# Patient Record
Sex: Male | Born: 1995 | Race: Black or African American | Hispanic: No | Marital: Single | Smoking: Former smoker
Health system: Southern US, Community
[De-identification: ages and names within clinical notes are randomized; demographics above are authoritative.]

---

## 2017-03-17 ENCOUNTER — Encounter (HOSPITAL_COMMUNITY): Payer: Self-pay | Admitting: Emergency Medicine

## 2017-03-17 ENCOUNTER — Observation Stay (HOSPITAL_COMMUNITY)
Admission: EM | Admit: 2017-03-17 | Discharge: 2017-03-18 | Disposition: A | Discharge status: Home / Self Care | Payer: PRIVATE HEALTH INSURANCE | Attending: Cardiovascular Disease | Admitting: Cardiovascular Disease

## 2017-03-17 ENCOUNTER — Emergency Department (HOSPITAL_COMMUNITY): Payer: PRIVATE HEALTH INSURANCE

## 2017-03-17 ENCOUNTER — Ambulatory Visit (INDEPENDENT_AMBULATORY_CARE_PROVIDER_SITE_OTHER)
Admission: EM | Admit: 2017-03-17 | Discharge: 2017-03-17 | Disposition: A | Discharge status: Home / Self Care | Payer: PRIVATE HEALTH INSURANCE | Source: Home / Self Care | Attending: Emergency Medicine | Admitting: Emergency Medicine

## 2017-03-17 ENCOUNTER — Other Ambulatory Visit: Payer: Self-pay

## 2017-03-17 DIAGNOSIS — I514 Myocarditis, unspecified: Secondary | ICD-10-CM | POA: Diagnosis present

## 2017-03-17 DIAGNOSIS — I401 Isolated myocarditis: Secondary | ICD-10-CM

## 2017-03-17 DIAGNOSIS — Z87891 Personal history of nicotine dependence: Secondary | ICD-10-CM | POA: Insufficient documentation

## 2017-03-17 DIAGNOSIS — R778 Other specified abnormalities of plasma proteins: Secondary | ICD-10-CM

## 2017-03-17 DIAGNOSIS — Z8249 Family history of ischemic heart disease and other diseases of the circulatory system: Secondary | ICD-10-CM | POA: Insufficient documentation

## 2017-03-17 DIAGNOSIS — R0789 Other chest pain: Secondary | ICD-10-CM | POA: Diagnosis present

## 2017-03-17 DIAGNOSIS — I400 Infective myocarditis: Secondary | ICD-10-CM

## 2017-03-17 DIAGNOSIS — R7989 Other specified abnormal findings of blood chemistry: Secondary | ICD-10-CM | POA: Insufficient documentation

## 2017-03-17 DIAGNOSIS — K219 Gastro-esophageal reflux disease without esophagitis: Secondary | ICD-10-CM | POA: Insufficient documentation

## 2017-03-17 DIAGNOSIS — R079 Chest pain, unspecified: Secondary | ICD-10-CM | POA: Diagnosis not present

## 2017-03-17 LAB — BASIC METABOLIC PANEL
ANION GAP: 10 (ref 5–15)
BUN: 5 mg/dL — ABNORMAL LOW (ref 6–20)
CALCIUM: 9.6 mg/dL (ref 8.9–10.3)
CO2: 26 mmol/L (ref 22–32)
CREATININE: 1.13 mg/dL (ref 0.61–1.24)
Chloride: 103 mmol/L (ref 101–111)
GLUCOSE: 109 mg/dL — AB (ref 65–99)
Potassium: 4.3 mmol/L (ref 3.5–5.1)
Sodium: 139 mmol/L (ref 135–145)

## 2017-03-17 LAB — CBC
HCT: 48.7 % (ref 39.0–52.0)
HEMOGLOBIN: 16.2 g/dL (ref 13.0–17.0)
MCH: 28.3 pg (ref 26.0–34.0)
MCHC: 33.3 g/dL (ref 30.0–36.0)
MCV: 85 fL (ref 78.0–100.0)
PLATELETS: 213 10*3/uL (ref 150–400)
RBC: 5.73 MIL/uL (ref 4.22–5.81)
RDW: 12.6 % (ref 11.5–15.5)
WBC: 7 10*3/uL (ref 4.0–10.5)

## 2017-03-17 LAB — RAPID URINE DRUG SCREEN, HOSP PERFORMED
AMPHETAMINES: NOT DETECTED
BENZODIAZEPINES: NOT DETECTED
Barbiturates: NOT DETECTED
Cocaine: NOT DETECTED
OPIATES: NOT DETECTED
Tetrahydrocannabinol: NOT DETECTED

## 2017-03-17 LAB — TROPONIN I
TROPONIN I: 10.67 ng/mL — AB (ref <0.03)
TROPONIN I: 9.72 ng/mL — AB (ref <0.03)
Troponin I: 6.83 ng/mL (ref <0.03)

## 2017-03-17 LAB — PROTIME-INR
INR: 1
Prothrombin Time: 13.1 seconds (ref 11.4–15.2)

## 2017-03-17 LAB — I-STAT TROPONIN, ED: TROPONIN I, POC: 8.94 ng/mL — AB (ref 0.00–0.08)

## 2017-03-17 LAB — TSH: TSH: 2.459 u[IU]/mL (ref 0.350–4.500)

## 2017-03-17 LAB — APTT: aPTT: 32 seconds (ref 24–36)

## 2017-03-17 MED ORDER — METOPROLOL TARTRATE 25 MG PO TABS
25.0000 mg | ORAL_TABLET | Freq: Two times a day (BID) | ORAL | Status: DC
  Administered 2017-03-17 – 2017-03-18 (×2): 25 mg via ORAL
  Filled 2017-03-17 (×2): qty 1

## 2017-03-17 MED ORDER — ACETAMINOPHEN 325 MG PO TABS: 650.0000 mg | ORAL_TABLET | ORAL | Status: DC | PRN

## 2017-03-17 MED ORDER — IBUPROFEN 800 MG PO TABS: 800.0000 mg | ORAL_TABLET | Freq: Four times a day (QID) | ORAL | Status: DC

## 2017-03-17 MED ORDER — ONDANSETRON HCL 4 MG/2ML IJ SOLN: 4.0000 mg | Freq: Four times a day (QID) | INTRAMUSCULAR | Status: DC | PRN

## 2017-03-17 MED ORDER — NITROGLYCERIN 0.4 MG SL SUBL: 0.4000 mg | SUBLINGUAL_TABLET | SUBLINGUAL | Status: DC | PRN

## 2017-03-17 MED ORDER — ASPIRIN 81 MG PO CHEW
CHEWABLE_TABLET | ORAL | Status: AC
  Administered 2017-03-17: 81 mg
  Filled 2017-03-17: qty 1

## 2017-03-17 MED ORDER — PANTOPRAZOLE SODIUM 40 MG PO TBEC: 40.0000 mg | DELAYED_RELEASE_TABLET | Freq: Every day | ORAL | Status: DC

## 2017-03-17 MED ORDER — COLCHICINE 0.6 MG PO TABS: 0.6000 mg | ORAL_TABLET | Freq: Two times a day (BID) | ORAL | Status: DC

## 2017-03-17 MED ORDER — HEPARIN BOLUS VIA INFUSION
4000.0000 [IU] | Freq: Once | INTRAVENOUS | Status: AC
  Administered 2017-03-17: 4000 [IU] via INTRAVENOUS
  Filled 2017-03-17: qty 4000

## 2017-03-17 MED ORDER — COLCHICINE 0.6 MG PO TABS
0.6000 mg | ORAL_TABLET | Freq: Once | ORAL | Status: AC
  Administered 2017-03-17: 0.6 mg via ORAL
  Filled 2017-03-17: qty 1

## 2017-03-17 MED ORDER — HEPARIN (PORCINE) IN NACL 100-0.45 UNIT/ML-% IJ SOLN
900.0000 [IU]/h | INTRAMUSCULAR | Status: DC
  Administered 2017-03-17: 900 [IU]/h via INTRAVENOUS
  Filled 2017-03-17: qty 250

## 2017-03-17 MED ORDER — ASPIRIN 81 MG PO CHEW
324.0000 mg | CHEWABLE_TABLET | Freq: Once | ORAL | Status: AC
  Administered 2017-03-17: 243 mg via ORAL
  Filled 2017-03-17: qty 4

## 2017-03-17 MED ORDER — IBUPROFEN 400 MG PO TABS
600.0000 mg | ORAL_TABLET | Freq: Three times a day (TID) | ORAL | Status: DC
  Administered 2017-03-18: 12:00:00 600 mg via ORAL
  Filled 2017-03-17 (×2): qty 1

## 2017-03-17 MED ORDER — ASPIRIN 325 MG PO TABS
325.0000 mg | ORAL_TABLET | Freq: Every day | ORAL | Status: DC
  Administered 2017-03-18: 325 mg via ORAL
  Filled 2017-03-17: qty 1

## 2017-03-17 MED ORDER — COLCHICINE 0.6 MG PO TABS
0.6000 mg | ORAL_TABLET | Freq: Two times a day (BID) | ORAL | Status: DC
  Administered 2017-03-18: 0.6 mg via ORAL
  Filled 2017-03-17: qty 1

## 2017-03-17 MED ORDER — SODIUM CHLORIDE 0.9 % IV SOLN
INTRAVENOUS | Status: DC
  Administered 2017-03-17: 50 mL/h via INTRAVENOUS

## 2017-03-17 MED ORDER — IBUPROFEN 400 MG PO TABS
600.0000 mg | ORAL_TABLET | Freq: Once | ORAL | Status: AC
  Administered 2017-03-17: 19:00:00 600 mg via ORAL
  Filled 2017-03-17: qty 1

## 2017-03-17 NOTE — ED Notes (Signed)
EKG captured and given to Dr Rhunette CroftNanavati for assessment. Monitor read "ST high" Dr Rhunette CroftNanavati just ordered to continue to monitor, no new orders.

## 2017-03-17 NOTE — ED Notes (Signed)
Nurse First and MD Rhunette CroftNanavati informed of pt's I-stat Troponin resulting to be 8.93.

## 2017-03-17 NOTE — ED Notes (Signed)
Report and care gotten from [place] at 2305.

## 2017-03-17 NOTE — Discharge Instructions (Signed)
Let them know immediately if your chest pain changes or gets worse.

## 2017-03-17 NOTE — ED Notes (Addendum)
Notified Dr. Juleen ChinaKohut about pts EKG done at [facility]gent [facility] less than an hour ago, wanted to see if we should get another one for his visit in the ED. Dr. Juleen ChinaKohut acknowledge and said to wait on the EKG until a ED doctor sees him. Notified Holley(RN)

## 2017-03-17 NOTE — ED Provider Notes (Signed)
HPI  SUBJECTIVE:  Omar Blanchard is a 21 y.o. male who presents with substernal constant chest pain described as pressure, throbbing with radiation to his left arm for the past several days.  There are no aggravating or alleviating factors.  He has not tried anything for this.  He denies radiation of this pain up his neck or through to his back.  No nausea, diaphoresis.  No belching, water brash.  It is not associated with exertion, lying down, eating, torso rotation or movement.  No fevers, coughing, wheezing, shortness of breath.  No abdominal pain.  No calf pain, lower extremity edema, dyspnea on exertion.  No hemoptysis, surgery in the past 4 weeks, immobilization, exogenous estrogen.  Past medical history of GERD.  No history of hypertension, diabetes, coronary disease, DVT, PE, cancer, hypercholesterolemia, MI, HIV.  He is not a smoker.  He had asthma as a child.  Family history significant for MI father at age 21.  PMD: None.  History reviewed. No pertinent past medical history.  History reviewed. No pertinent surgical history.  No family history on file.  Social History   Tobacco Use  . Smoking status: Not on file  Substance Use Topics  . Alcohol use: Not on file  . Drug use: Not on file    No current facility-administered medications for this encounter.  No current outpatient medications on file.  No Known Allergies   ROS  As noted in HPI.   Physical Exam  BP 139/82   Pulse 60   Temp 98.4 F (36.9 C)   Resp 16   SpO2 100%   Constitutional: Well developed, well nourished, no acute distress Eyes: PERRL, EOMI, conjunctiva normal bilaterally HENT: Normocephalic, atraumatic,mucus membranes moist Respiratory: Clear to auscultation bilaterally, no rales, no wheezing, no rhonchi.  No chest wall tenderness Cardiovascular: Normal rate and rhythm, no murmurs, no gallops, no rubs GI: Soft, nondistended, no pulsatile palpable masses nontender, no rebound, no  guarding skin: No rash, skin intact Musculoskeletal: Calves symmetric, nontender no edema,  no deformities Neurologic: Alert & oriented x 3, CN II-XII grossly intact, no motor deficits, sensation grossly intact Psychiatric: Speech and behavior appropriate   ED Course   Medications - No data to display  Orders Placed This Encounter  Procedures  . ED EKG    Standing Status:   Standing    Number of Occurrences:   1    Order Specific Question:   Reason for Exam    Answer:   Chest Pain  . EKG 12-Lead    Standing Status:   Standing    Number of Occurrences:   1  . EKG 12-Lead    Standing Status:   Standing    Number of Occurrences:   1   No results found for this or any previous visit (from the past 24 hour(s)). No results found.  ED Clinical Impression  Chest pain, unspecified type   ED Assessment/Plan  EKG: Normal sinus rhythm, rate 65.  Normal axis, normal intervals.  LVH.  No ST-T wave changes.  No previous EKG for comparison  Pt PERC negative.  Doubt PE.  EKG is reassuring.  However, given the nature of his pain, with radiation down his left arm and the family history, concern for cardiac cause of his pain.  Sending to the ED for comprehensive workup.  Feel that the patient is stable to go by private vehicle.  Discussed rationale for transfer with the patient, who agrees with plan.   No  orders of the defined types were placed in this encounter.   *This clinic note was created using Dragon dictation software. Therefore, there may be occasional mistakes despite careful proofreading.  ?    Domenick GongMortenson, Aurthur Wingerter, MD 03/17/17 [phone]

## 2017-03-17 NOTE — Progress Notes (Signed)
ANTICOAGULATION CONSULT NOTE - Initial Consult  Pharmacy Consult for Heparin  Indication: chest pain/ACS  No Known Allergies  Patient Measurements: Height: 5\' 6"  (167.6 cm) Weight: 150 lb (68 kg) IBW/kg (Calculated) : 63.8 Heparin Dosing Weight: 68kg   Vital Signs: Temp: 98.7 F (37.1 C) (11/12 1628) Temp Source: Oral (11/12 1628) BP: 124/88 (11/12 1715) Pulse Rate: 64 (11/12 1715)  Labs: Recent Labs    03/17/17 1628  HGB 16.2  HCT 48.7  PLT 213  CREATININE 1.13    Estimated Creatinine Clearance: 93.3 mL/min (by C-G formula based on SCr of 1.13 mg/dL).   Medical History: History reviewed. No pertinent past medical history.   Assessment: Pt 21YO male presenting with left sided chest pain. FMH significant for father MI at 520.  Pharmacy consulted to dose Heparin.   Troponin 8.94, Hgb 16.2, platelets 213, No s/sx active bleeding, No anticoagulation prior to admission   Goal of Therapy:  Heparin level 0.3-0.7 units/ml Monitor platelets by anticoagulation protocol: Yes   Plan:  Give Heparin 4000 units bolus x 1 Start heparin infusion at 900 units/hr Continue to monitor H&H and platelets   F/U 6 hr heparin level and daily CBC and heparin level, any s/sx of bleeding   Karna DupesMedina Nadiah Corbit  PharmD Candidate '19 03/10/2017 3:30 PM

## 2017-03-17 NOTE — ED Provider Notes (Signed)
[facility] Provider Note   CSN: [id number] Arrival date & time: 03/17/17  1619 History   Chief Complaint Chief Complaint  Patient presents with  . Chest Pain    HPI Omar Blanchard is a 21 y.o. male with no significant past medical history who presents with throbbing chest pressure for the past 2-3 days.  Patient states the pain radiates down his left arm.  He denies that it is worse with exertion and states that has not noted anything that really makes it worse.  Denies anything makes it better except trying to take his mind off of it.  He has not had any associated shortness of breath.  Denies any history or recent use of cocaine or any other drugs.  Patient states that his father had a heart attack in his 7720s but does not know why he had it once or early.  He denies any personal or family history of blood clots or clotting disorders.  He has not had any recent fevers or chills.  HPI  History reviewed. No pertinent past medical history.  There are no active problems to display for this patient.  History reviewed. No pertinent surgical history.  Home Medications    Prior to Admission medications   Not on File   Family History History reviewed. No pertinent family history.  Social History Social History   Tobacco Use  . Smoking status: Former Games developermoker  . Smokeless tobacco: Never Used  Substance Use Topics  . Alcohol use: Yes    Comment: occasional   . Drug use: No   Allergies   Patient has no known allergies.  Review of Systems Review of Systems  Constitutional: Negative for chills and fever.  HENT: Negative for ear pain and sore throat.   Eyes: Negative for pain and visual disturbance.  Respiratory: Negative for cough and shortness of breath.   Cardiovascular: Positive for chest pain. Negative for palpitations.  Gastrointestinal: Negative for abdominal pain and vomiting.  Genitourinary: Negative for dysuria and hematuria.    Musculoskeletal: Negative for arthralgias and back pain.  Skin: Negative for color change and rash.  Neurological: Negative for seizures and syncope.  All other systems reviewed and are negative.  Physical Exam Updated Vital Signs BP (!) 158/85 (BP Location: Right Arm)   Pulse (!) 56   Temp 98.7 F (37.1 C) (Oral)   Resp 16   Ht 5\' 6"  (1.676 m)   Wt 68 kg (150 lb)   SpO2 100%   BMI 24.21 kg/m   Physical Exam  Constitutional: He appears well-developed and well-nourished.  HENT:  Head: Normocephalic and atraumatic.  Eyes: Conjunctivae are normal.  Neck: Neck supple.  Cardiovascular: Normal rate and regular rhythm.  No murmur heard. Pulmonary/Chest: Effort normal and breath sounds normal. No respiratory distress.  Abdominal: Soft. There is no tenderness.  Musculoskeletal: He exhibits no edema.  Neurological: He is alert.  Skin: Skin is warm and dry.  Psychiatric: He has a normal mood and affect.  Nursing note and vitals reviewed.  ED Treatments / Results  Labs (all labs ordered are listed, but only abnormal results are displayed) Labs Reviewed  BASIC METABOLIC PANEL - Abnormal; Notable for the following components:      Result Value   Glucose, Bld 109 (*)    BUN 5 (*)    All other components within normal limits  I-STAT TROPONIN, ED - Abnormal; Notable for the following components:   Troponin i, poc 8.94 (*)  All other components within normal limits  CBC  TROPONIN I  TROPONIN I  APTT  PROTIME-INR  RAPID URINE DRUG SCREEN, HOSP PERFORMED    EKG  EKG Interpretation None       Radiology Dg Chest 2 View  Result Date: 03/17/2017 CLINICAL DATA:  CP. Pt has been having cp today in his middle chest, and slightly to the left of his sternum. No sob, no dizziness or nausea. No other pains or complaints. EXAM: CHEST  2 VIEW COMPARISON:  None. FINDINGS: The heart size and mediastinal contours are within normal limits. Both lungs are clear. No pleural effusion or  pneumothorax. The visualized skeletal structures are unremarkable. IMPRESSION: No active cardiopulmonary disease. Electronically Signed   By: Amie Portlandavid  Ormond M.D.   On: 03/17/2017 17:40    Procedures Procedures (including critical care time)  Medications Ordered in ED Medications  colchicine tablet 0.6 mg (0.6 mg Oral Not Given 03/17/17 2300)  aspirin tablet 325 mg (not administered)  metoprolol tartrate (LOPRESSOR) tablet 25 mg (25 mg Oral Given 03/17/17 2323)  colchicine tablet 0.6 mg (not administered)  ibuprofen (ADVIL,MOTRIN) tablet 600 mg (0 mg Oral Hold 03/17/17 2316)  nitroGLYCERIN (NITROSTAT) SL tablet 0.4 mg (not administered)  acetaminophen (TYLENOL) tablet 650 mg (not administered)  ondansetron (ZOFRAN) injection 4 mg (not administered)  0.9 %  sodium chloride infusion (50 mL/hr Intravenous New Bag/Given 03/17/17 2322)  pantoprazole (PROTONIX) EC tablet 40 mg (0 mg Oral Hold 03/17/17 2319)  aspirin chewable tablet 324 mg (243 mg Oral Given 03/17/17 1716)  aspirin 81 MG chewable tablet (81 mg  Given 03/17/17 1742)  heparin bolus via infusion 4,000 Units (4,000 Units Intravenous Bolus from Bag 03/17/17 1758)  colchicine tablet 0.6 mg (0.6 mg Oral Given 03/17/17 1900)  ibuprofen (ADVIL,MOTRIN) tablet 600 mg (600 mg Oral Given 03/17/17 1900)   Initial Impression / Assessment and Plan / ED Course  I have reviewed the triage vital signs and the nursing notes.  Pertinent labs & imaging results that were available during my care of the patient were reviewed by me and considered in my medical decision making (see chart for details).  Clinical Course as of Mar 18 228  Mon Mar 17, 2017  1715 MCV: 85.0 [AM]    Clinical Course User Index [AM] Domenick GongMortenson, Ashley, MD    Omar Blanchard is a 21 y.o. male with no sitting past medical history who presents with chest pain found to have an elevated troponin.  Studies and imaging  Labs remarkable for: trop 8.94, CBC wnl, BMP wnl CXR:  NAD  EKG: no evidence of acute ST elevated MI, arrhthymias or   Discussed this patient's case with the cardiologist who at this time after reviewing the EKG and lab believes that the patient symptoms are most likely secondary to myocarditis.    Heparin discontinued at cardiology's recommendation, colchicine and motrin started in the ED.  Troponin trending down.   Discussed patient's case with cardiology who will admit the patient.  Final Clinical Impressions(s) / ED Diagnoses   Final diagnoses:  Chest pain  Elevated troponin  Acute idiopathic myocarditis    ED Discharge Orders    None       Lamont SnowballGarza, Johngabriel Verde, MD 03/18/17 [id number]    Derwood KaplanNanavati, Ankit, MD 03/18/17 1453

## 2017-03-17 NOTE — ED Triage Notes (Signed)
Pt c/o pressure in chest.

## 2017-03-17 NOTE — Consult Note (Signed)
CARDIOLOGY CONSULT NOTE       Patient ID: Omar Blanchard MRN: [id number] DOB/AGE: 21/07/1995 21 y.o.  Admit date: 03/17/2017 Referring Physician: Rhunette CroftNanavati Primary Physician: Patient, No Pcp Per Primary Cardiologist: New/Keron Neenan Reason for Consultation: Chest Pain Elevated troponin  Active Problems:   * No active hospital problems. *   HPI:  Omar Blanchard is a 21 y.o. male who is being seen today for the evaluation of chest pain at the request of Dr Rhunette CroftNanavati. He has no previous significant medical history Denies drugs, ETOH excess , smoking. SSCP / pressure for 3 days. Initially noted when he awoke from sleep. Pain fairly constant for 3 days. ASA helped and pain free in ER now. Did not try any ASA or NSAI's at home. Has had a "cold" with sniffles but no fever No sick contacts. 306 yo brother healthy and father had MI at young age. ECG is non acute with early repolarization. Troponin I elevated 8.94. Denies dyspnea, palpitations syncope myalgias, fever cough or febrile illness. No history of connective tissue disease or lupus.   ROS All other systems reviewed and negative except as noted above  History reviewed. No pertinent past medical history.  History reviewed. No pertinent family history.  Social History   Socioeconomic History  . Marital status: Single    Spouse name: Not on file  . Number of children: Not on file  . Years of education: Not on file  . Highest education level: Not on file  Social Needs  . Financial resource strain: Not on file  . Food insecurity - worry: Not on file  . Food insecurity - inability: Not on file  . Transportation needs - medical: Not on file  . Transportation needs - non-medical: Not on file  Occupational History  . Not on file  Tobacco Use  . Smoking status: Former Games developermoker  . Smokeless tobacco: Never Used  Substance and Sexual Activity  . Alcohol use: Yes    Comment: occasional   . Drug use: No  . Sexual activity: Not on file  Other  Topics Concern  . Not on file  Social History Narrative  . Not on file    History reviewed. No pertinent surgical history.   . heparin  4,000 Units Intravenous Once   . heparin      Physical Exam: BP 124/88   Pulse 64   Temp 98.7 F (37.1 C) (Oral)   Resp 19   Ht 5\' 6"  (1.676 m)   Wt 150 lb (68 kg)   SpO2 100%   BMI 24.21 kg/m   Affect appropriate Healthy:  appears stated age HEENT: normal Neck supple with no adenopathy JVP normal no bruits no thyromegaly Lungs clear with no wheezing and good diaphragmatic motion Heart:  S1/S2 no murmur, no rub, gallop or click PMI normal Abdomen: benighn, BS positve, no tenderness, no AAA no bruit.  No HSM or HJR Distal pulses intact with no bruits No edema Neuro non-focal Skin warm and dry No muscular weakness   Labs:   Lab Results  Component Value Date   WBC 7.0 03/17/2017   HGB 16.2 03/17/2017   HCT 48.7 03/17/2017   MCV 85.0 03/17/2017   PLT 213 03/17/2017    Recent Labs  Lab 03/17/17 1628  NA 139  K 4.3  CL 103  CO2 26  BUN 5*  CREATININE 1.13  CALCIUM 9.6  GLUCOSE 109*   No results found for: CKTOTAL, CKMB, CKMBINDEX, TROPONINI No results found for: CHOL  No results found for: HDL No results found for: LDLCALC No results found for: TRIG No results found for: CHOLHDL No results found for: LDLDIRECT    Radiology: Dg Chest 2 View  Result Date: 03/17/2017 CLINICAL DATA:  CP. Pt has been having cp today in his middle chest, and slightly to the left of his sternum. No sob, no dizziness or nausea. No other pains or complaints. EXAM: CHEST  2 VIEW COMPARISON:  None. FINDINGS: The heart size and mediastinal contours are within normal limits. Both lungs are clear. No pleural effusion or pneumothorax. The visualized skeletal structures are unremarkable. IMPRESSION: No active cardiopulmonary disease. Electronically Signed   By: Amie Portlandavid  Ormond M.D.   On: 03/17/2017 17:40    EKG: SR early repolarization no acute  changes no old one to compare   ASSESSMENT AND PLAN:   1) Chest pain with elevated troponin Patient is pain free now with no ECG changes Doubt acute epicardial coronary syndrome D/C heparin More likely myocarditis in setting of viral illness. Admit Telemetry but no arrhythmias so far Rx with NSAI's and colchicine. Will order cardiac MRI in am as well as cardiac CT to r/o CAD. And assess for late gadolinium uptake in myocardium If troponin trends down and no CAD on CT and telemetry with no arrhythmia can likely be d/c late tomorrow  Signed: Charlton Hawseter Woodie Trusty 03/17/2017, 5:56 PM

## 2017-03-17 NOTE — ED Notes (Signed)
Patient transported to X-ray 

## 2017-03-17 NOTE — ED Triage Notes (Signed)
[facility] for central chest pressure/throbbing onset 3-4 days ago with radiation down left arm. Pt denies other symptoms.

## 2017-03-17 NOTE — ED Notes (Signed)
Patient requests ekg to be faxed to mother.  Dr Chaney Mallingmortenson is in agreement and provided fax number to this nurse.  Cindy/patient access faxing EKG

## 2017-03-17 NOTE — ED Notes (Signed)
Lab called with critical troponin, 6.83. MD notified.

## 2017-03-18 ENCOUNTER — Inpatient Hospital Stay (HOSPITAL_COMMUNITY): Payer: PRIVATE HEALTH INSURANCE

## 2017-03-18 ENCOUNTER — Other Ambulatory Visit: Payer: Self-pay | Admitting: Internal Medicine

## 2017-03-18 DIAGNOSIS — I409 Acute myocarditis, unspecified: Secondary | ICD-10-CM | POA: Diagnosis not present

## 2017-03-18 DIAGNOSIS — R079 Chest pain, unspecified: Secondary | ICD-10-CM | POA: Diagnosis not present

## 2017-03-18 DIAGNOSIS — I400 Infective myocarditis: Secondary | ICD-10-CM | POA: Diagnosis not present

## 2017-03-18 LAB — CBC
HEMATOCRIT: 43 % (ref 39.0–52.0)
Hemoglobin: 14.2 g/dL (ref 13.0–17.0)
MCH: 28 pg (ref 26.0–34.0)
MCHC: 33 g/dL (ref 30.0–36.0)
MCV: 84.8 fL (ref 78.0–100.0)
PLATELETS: 213 10*3/uL (ref 150–400)
RBC: 5.07 MIL/uL (ref 4.22–5.81)
RDW: 13.1 % (ref 11.5–15.5)
WBC: 5 10*3/uL (ref 4.0–10.5)

## 2017-03-18 LAB — LIPID PANEL
CHOL/HDL RATIO: 2.7 ratio
CHOLESTEROL: 123 mg/dL (ref 0–200)
HDL: 46 mg/dL (ref >40)
LDL Cholesterol: 67 mg/dL (ref 0–99)
Triglycerides: 49 mg/dL (ref <150)
VLDL: 10 mg/dL (ref 0–40)

## 2017-03-18 LAB — BASIC METABOLIC PANEL
ANION GAP: 8 (ref 5–15)
BUN: 6 mg/dL (ref 6–20)
CHLORIDE: 105 mmol/L (ref 101–111)
CO2: 23 mmol/L (ref 22–32)
Calcium: 8.8 mg/dL — ABNORMAL LOW (ref 8.9–10.3)
Creatinine, Ser: 0.98 mg/dL (ref 0.61–1.24)
GFR calc Af Amer: >60 mL/min (ref >60)
GFR calc non Af Amer: >60 mL/min (ref >60)
GLUCOSE: 80 mg/dL (ref 65–99)
POTASSIUM: 3.7 mmol/L (ref 3.5–5.1)
Sodium: 136 mmol/L (ref 135–145)

## 2017-03-18 LAB — CMV IGM: CMV IgM: <30 AU/mL (ref 0.0–29.9)

## 2017-03-18 LAB — EPSTEIN-BARR VIRUS VCA, IGG

## 2017-03-18 LAB — TROPONIN I
Troponin I: 5.68 ng/mL (ref <0.03)
Troponin I: 2.69 ng/mL (ref <0.03)

## 2017-03-18 LAB — HIV ANTIBODY (ROUTINE TESTING W REFLEX): HIV Screen 4th Generation wRfx: NONREACTIVE

## 2017-03-18 LAB — EPSTEIN-BARR VIRUS VCA, IGM

## 2017-03-18 MED ORDER — IBUPROFEN 800 MG PO TABS: 800.0000 mg | ORAL_TABLET | Freq: Three times a day (TID) | ORAL | 0 refills | Status: AC

## 2017-03-18 MED ORDER — NITROGLYCERIN 0.4 MG SL SUBL
SUBLINGUAL_TABLET | SUBLINGUAL | Status: AC
  Filled 2017-03-18: qty 1

## 2017-03-18 MED ORDER — PANTOPRAZOLE SODIUM 40 MG PO TBEC: 40.0000 mg | DELAYED_RELEASE_TABLET | Freq: Every day | ORAL | 0 refills | Status: AC

## 2017-03-18 MED ORDER — COLCHICINE 0.6 MG PO TABS: 0.6000 mg | ORAL_TABLET | Freq: Two times a day (BID) | ORAL | 2 refills | Status: AC

## 2017-03-18 MED ORDER — ASPIRIN 325 MG PO TABS: 325.0000 mg | ORAL_TABLET | Freq: Every day | ORAL | 0 refills | Status: AC

## 2017-03-18 MED ORDER — IOPAMIDOL (ISOVUE-370) INJECTION 76%
INTRAVENOUS | Status: AC
  Filled 2017-03-18: qty 100

## 2017-03-18 MED ORDER — GADOBENATE DIMEGLUMINE 529 MG/ML IV SOLN
30.0000 mL | Freq: Once | INTRAVENOUS | Status: AC
  Administered 2017-03-18: 25 mL via INTRAVENOUS

## 2017-03-18 NOTE — ED Notes (Signed)
Patient verbalized understanding of discharge instructions and denies any further needs or questions at this time. VS stable. Patient ambulatory with steady gait. RN escorted patient to ED entrance in wheelchair.

## 2017-03-18 NOTE — ED Notes (Signed)
Pt transported to CT ?

## 2017-03-18 NOTE — Progress Notes (Signed)
Reviewed MRI and consistent with myocarditis with small area of subeipicardial uptake in mid inferior wall and apex. CTA with no coronary artery disease and calcium score 0. Viral titers negative as is drug screen. Troponin steadily coming down and no arrhythmias on telemetry. Ok to d/c home with motrin 800 mg tid and colchicine .60 mg bid and ASA 325 mg daily. Will arrange outpatient f/u with me Ok to work at [employer]  Galdino Hinchman

## 2017-03-18 NOTE — ED Notes (Signed)
Pt has returned from CT.  

## 2017-03-18 NOTE — Discharge Summary (Signed)
Discharge Summary    Patient ID: Omar RaoMartavious Welles,  MRN: [id number], DOB/AGE: 21/07/1995 21 y.o.  Admit date: 03/17/2017 Discharge date: 03/18/2017  Primary Care Provider: Patient, No Pcp Per Primary Cardiologist: Dr. Eden EmmsNishan  Discharge Diagnoses    Principal Problem:   Myocarditis [facility](HCC) Active Problems:   Chest pain   History of Present Illness     Omar Blanchard is a 21 y.o. male with no significant past medical history who presented to [facility] on 03/17/2017 for evaluation of chest pain and was [facility].   He reported having episodes of chest pain for the past 2-3 days which was not worse with exertion or positional changes. Reported having a mild cold over the past several days but no significant illness. Initial troponin was found to be elevated to 8.94, therefore he was admitted for further observation.   Hospital Course     Consultants: None  The following morning, he denied any recurrent chest pain. Breathing was at baseline. Cyclic troponin values peaked at 10.67 and were trending down to 2.69 on the most recent check. A Coronary CT was performed and showed a calcium score of 0 suggesting low-risk for future cardiac events with no plaque or stenosis noted in the coronary arteries. 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   He was last examined by Dr. Eden EmmsNishan and deemed stable for discharge. He will be on ASA 325mg  daily, Ibuprofen 800mg  TID, and Protonix for 14 days. Will continue on Colchicine 0.6mg  BID for 3 months. Cardiology follow-up has been arranged. He was discharged home in good condition.   _____________  Discharge Vitals Blood pressure 113/70, pulse (!) 56,  temperature 98.7 F (37.1 C), temperature source Oral, resp. rate (!) 21, height 5\' 6"  (1.676 m), weight 150 lb (68 kg), SpO2 100 %.  Filed Weights   03/17/17 1710  Weight: 150 lb (68 kg)    Labs & Radiologic Studies     CBC Recent Labs    03/17/17 1628 03/18/17 0405  WBC 7.0 5.0  HGB 16.2 14.2  HCT 48.7 43.0  MCV 85.0 84.8  PLT 213 213   Basic Metabolic Panel Recent Labs    75/64/3310/04/22 1628 03/18/17 0405  NA 139 136  K 4.3 3.7  CL 103 105  CO2 26 23  GLUCOSE 109* 80  BUN 5* 6  CREATININE 1.13 0.98  CALCIUM 9.6 8.8*   Liver Function Tests No results for input(s): AST, ALT, ALKPHOS, BILITOT, PROT, ALBUMIN in the last 72 hours. No results for input(s): LIPASE, AMYLASE in the last 72 hours. Cardiac Enzymes Recent Labs    03/17/17 2228 03/18/17 0405 03/18/17 1050  TROPONINI 9.72* 5.68* 2.69*   BNP Invalid input(s): POCBNP D-Dimer No results for input(s): DDIMER in the last 72 hours. Hemoglobin A1C No results for input(s): HGBA1C in the last 72 hours. Fasting Lipid Panel Recent Labs    03/18/17 0405  CHOL 123  HDL 46  LDLCALC 67  TRIG 49  CHOLHDL 2.7   Thyroid Function Tests Recent Labs    03/17/17 2228  TSH 2.459    Dg Chest 2 View  Result Date: 03/17/2017 CLINICAL DATA:  CP. Pt has been having cp today in his middle  chest, and slightly to the left of his sternum. No sob, no dizziness or nausea. No other pains or complaints. EXAM: CHEST  2 VIEW COMPARISON:  None. FINDINGS: The heart size and mediastinal contours are within normal limits. Both lungs are clear. No pleural effusion or pneumothorax. The visualized skeletal structures are unremarkable. IMPRESSION: No active cardiopulmonary disease. Electronically Signed   By: Amie Portlandavid  Ormond M.D.   On: 03/17/2017 17:40   Ct Coronary Morph W/cta Cor W/score W/ca W/cm &/or Wo/cm  Addendum Date: 03/18/2017   ADDENDUM REPORT: 03/18/2017 13:31 CLINICAL DATA:  Chest pain, elevated troponin EXAM: Cardiac CTA  MEDICATIONS: Sub lingual nitro. 4mg  and lopressor 5mg  IV TECHNIQUE: The patient was scanned on a Siemens Force 192 slice scanner. Gantry rotation speed was 240 msecs. Collimation was 0.6 mm. A 100 kV prospective scan was triggered in the ascending thoracic aorta at 35-75% of the R-R interval. Average HR during the scan was 65 bpm. The 3D data set was interpreted on a dedicated work station using MPR, MIP and VRT modes. A total of 80cc of contrast was used. FINDINGS: Non-cardiac: See separate report from [facility]. Calcium Score:  0 Agatston units Coronary Arteries: Right dominant with no anomalies LM:  No plaque or stenosis. LAD system:  No plaque or stenosis. Circumflex system: Moderate ramus. The AV LCx itself was relatively small. No plaque or stenosis. RCA system:  No plaque or stenosis. IMPRESSION: 1. Coronary calcium score 0 Agatston units suggesting low risk for future cardiac events. 2.  No plaque or stenosis noted in the coronary arteries. Dalton Mclean Electronically Signed   By: Marca Anconaalton  Mclean M.D.   On: 03/18/2017 13:31   Result Date: 03/18/2017 EXAM: OVER-READ INTERPRETATION  CT CHEST The following report is an over-read performed by radiologist Dr. Maryelizabeth RowanStewart [facility]so[facility] on 03/18/2017. This over-read does not include interpretation of cardiac or coronary anatomy or pathology. The coronary CTA interpretation by the cardiologist is attached. COMPARISON:  None. FINDINGS: Limited view of the lung parenchyma demonstrates no suspicious nodularity. Airways are normal. Limited view of the mediastinum demonstrates no adenopathy. Esophagus normal. Limited view of the upper abdomen unremarkable. Limited view of the skeleton and chest wall is unremarkable. IMPRESSION: No significant extracardiac findings. Electronically Signed: By: Genevive BiStewart  Edmunds M.D. On: 03/18/2017 12:47   Mr Cardiac Morphology W Wo Contrast  Result Date: 03/18/2017 CLINICAL DATA:  Myocarditis EXAM:  CARDIAC MRI TECHNIQUE: The patient was scanned on a 1.5 Tesla GE magnet. A dedicated cardiac coil was used. Functional imaging was done using Fiesta sequences. 2,3, and 4 chamber views were done to assess for RWMA's. Modified Simpson's rule using a short axis stack was used to calculate an ejection fraction on a dedicated work Research officer, trade unionstation using Circle software. The patient received 20 cc of Multihance. After 10 minutes inversion recovery sequences were used to assess for infiltration and scar tissue. CONTRAST:  20 cc Multihance FINDINGS: The LA/LV were normal in size and function. The RV had a lateral wall diverticulum that is probably congential and not clinically significant. 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   On: 03/18/2017 09:59     Diagnostic Studies/Procedures    None Performed.   Disposition   Pt is being discharged home today in good condition.  Follow-up Plans & Appointments    Follow-up Information    Wendall Stade, MD Follow up on 05/26/2017.   Specialty:  Cardiology Why:  Cardiology Hospital Follow-Up on 05/26/2017 at 10:30AM.  Contact information: [address] [place] [address] [phone]            Discharge Medications     Medication List    TAKE these medications   aspirin 325 MG tablet Take 1 tablet (325 mg total) daily for 14 days by mouth. Start taking on:   03/19/2017   colchicine 0.6 MG tablet Take 1 tablet (0.6 mg total) 2 (two) times daily by mouth.   ibuprofen 800 MG tablet Commonly known as:  ADVIL,MOTRIN Take 1 tablet (800 mg total) 3 (three) times daily for 14 days by mouth.   pantoprazole 40 MG tablet Commonly known as:  PROTONIX Take 1 tablet (40 mg total) daily at 6 (six) AM for 14 days by mouth. Start taking on:  03/19/2017         Allergies No Known Allergies   Outstanding Labs/Studies   None  Duration of Discharge Encounter   Greater than 30 minutes including physician time.  Signed, Ellsworth Lennox, PA-C 03/18/2017, 5:57 PM

## 2017-03-18 NOTE — Progress Notes (Signed)
Progress Note  Patient Name: Omar Blanchard Date oHinton Raof Encounter: 03/18/2017  Primary Cardiologist: Eden EmmsNishan  Subjective   Still in ER no chest pain   Inpatient Medications    Scheduled Meds: . aspirin  325 mg Oral Daily  . colchicine  0.6 mg Oral BID  . colchicine  0.6 mg Oral BID  . ibuprofen  600 mg Oral TID  . metoprolol tartrate  25 mg Oral BID  . pantoprazole  40 mg Oral Q0600   Continuous Infusions: . sodium chloride 50 mL/hr (03/17/17 2322)   PRN Meds: acetaminophen, nitroGLYCERIN, ondansetron (ZOFRAN) IV   Vital Signs    Vitals:   03/18/17 0700 03/18/17 0720 03/18/17 0733 03/18/17 0745  BP: 99/60  121/79   Pulse: (!) 50   (!) 56  Resp: 15   (!) 21  Temp:      TempSrc:      SpO2: 100% 100%  100%  Weight:      Height:       No intake or output data in the 24 hours ending 03/18/17 0856 Filed Weights   03/17/17 1710  Weight: 150 lb (68 kg)    Telemetry    NSR no arrhythmia  - Personally Reviewed  ECG    NSR early repolarization  - Personally Reviewed  Physical Exam   GEN: No acute distress.   Neck: No JVD Cardiac: RRR, no murmurs, rubs, or gallops.  Respiratory: Clear to auscultation bilaterally. GI: Soft, nontender, non-distended  MS: No edema; No deformity. Neuro:  Nonfocal  Psych: Normal affect   Labs    Chemistry Recent Labs  Lab 03/17/17 1628 03/18/17 0405  NA 139 136  K 4.3 3.7  CL 103 105  CO2 26 23  GLUCOSE 109* 80  BUN 5* 6  CREATININE 1.13 0.98  CALCIUM 9.6 8.8*  GFRNONAA >60 >60  GFRAA >60 >60  ANIONGAP 10 8     Hematology Recent Labs  Lab 03/17/17 1628 03/18/17 0405  WBC 7.0 5.0  RBC 5.73 5.07  HGB 16.2 14.2  HCT 48.7 43.0  MCV 85.0 84.8  MCH 28.3 28.0  MCHC 33.3 33.0  RDW 12.6 13.1  PLT 213 213    Cardiac Enzymes Recent Labs  Lab 03/17/17 1714 03/17/17 2200 03/17/17 2228 03/18/17 0405  TROPONINI 6.83* 10.67* 9.72* 5.68*    Recent Labs  Lab 03/17/17 1643  TROPIPOC 8.94*     BNPNo  results for input(s): BNP, PROBNP in the last 168 hours.   DDimer No results for input(s): DDIMER in the last 168 hours.   Radiology    Dg Chest 2 View  Result Date: 03/17/2017 CLINICAL DATA:  CP. Pt has been having cp today in his middle chest, and slightly to the left of his sternum. No sob, no dizziness or nausea. No other pains or complaints. EXAM: CHEST  2 VIEW COMPARISON:  None. FINDINGS: The heart size and mediastinal contours are within normal limits. Both lungs are clear. No pleural effusion or pneumothorax. The visualized skeletal structures are unremarkable. IMPRESSION: No active cardiopulmonary disease. Electronically Signed   By: Amie Portlandavid  Ormond M.D.   On: 03/17/2017 17:40    Cardiac Studies   Cardiac CT/MRI pending   Patient Profile     21 y.o. 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 Consider nasal swab for influenza. Heparin stopped in ER as this is highly unlikely to be acute coronary syndrome   For questions or updates, please contact [facility] Please consult [email or web address] for contact info under Cardiology/STEMI.      Signed, Charlton HawsPeter Seara Hinesley, MD  03/18/2017, 8:56 AM

## 2017-03-19 LAB — RSV(RESPIRATORY SYNCYTIAL VIRUS) AB, BLOOD: RSV AB: NEGATIVE

## 2017-05-26 ENCOUNTER — Ambulatory Visit: Payer: PRIVATE HEALTH INSURANCE | Admitting: Cardiovascular Disease

## 2018-09-16 IMAGING — CT CT HEART MORP W/ CTA COR W/ SCORE W/ CA W/CM &/OR W/O CM
4 of 7 series · 8 of 20 positions shown, 9 images · IV contrast (APPLIED)
Comparison: None.

CLINICAL DATA: Chest pain, elevated troponin

EXAM:
Cardiac CTA
MEDICATIONS:
Sub lingual nitro. 4mg and lopressor 5mg IV
TECHNIQUE: The patient was scanned on a Siemens Force [REDACTED]ice scanner. Gantry
rotation speed was 240 msecs. Collimation was 0.6 mm. A 100 kV
prospective scan was triggered in the ascending thoracic aorta at
35-75% of the R-R interval. Average HR during the scan was 65 bpm.
The 3D data set was interpreted on a dedicated work station using
MPR, MIP and VRT modes. A total of 80cc of contrast was used.

[Series 7: best diast 72 % · axial · 0.29mm/px · z∈[+1123,+1168]mm · 2 of 341 slices shown, 3 images]
[im 114/341  vessel]
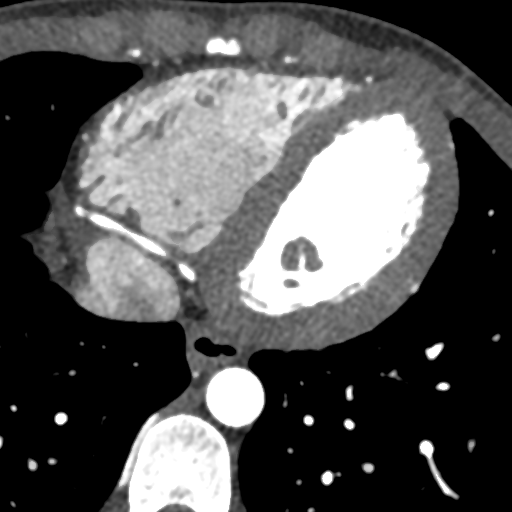
[im 114/341  lung]
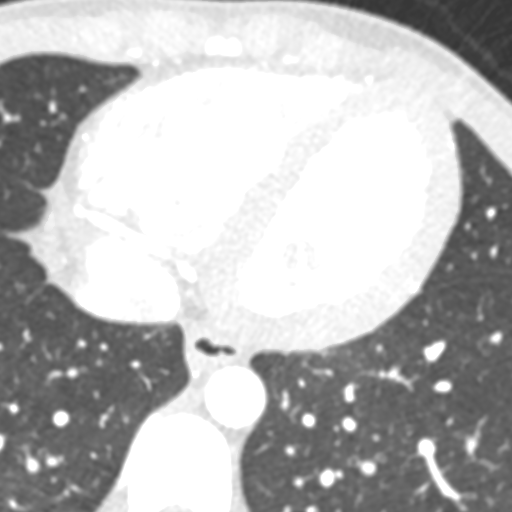
[im 227/341  vessel]
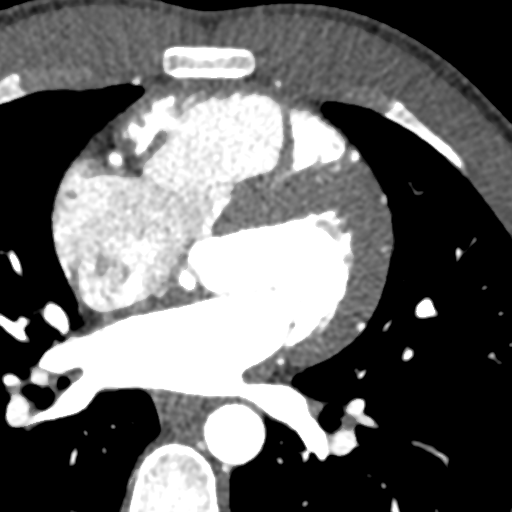

[Series 8: best syst 37 % · axial · 0.29mm/px · z∈[+1123,+1168]mm · 2 of 341 slices shown]
[im 114/341  vessel]
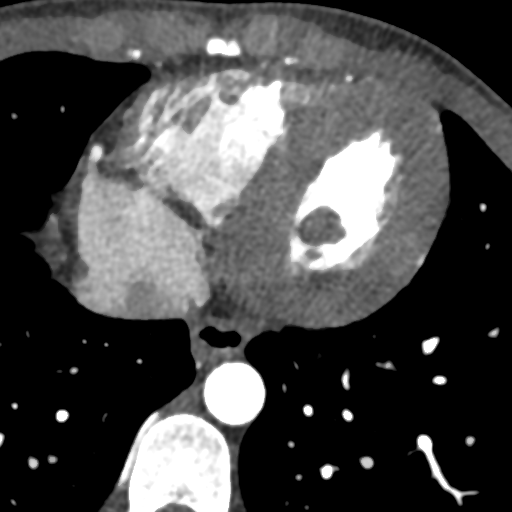
[im 227/341  vessel]
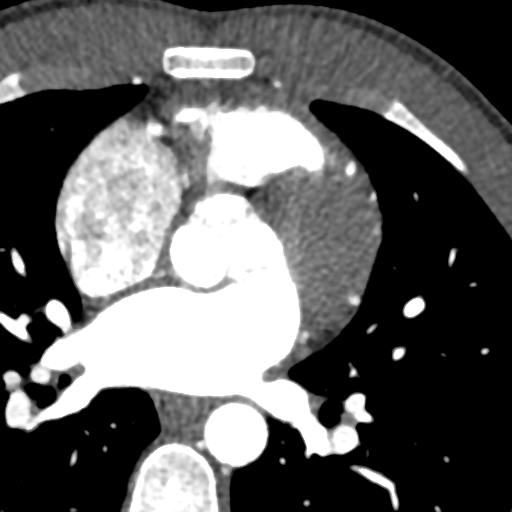

[Series 9: ts diast sharp 72 % · axial · 0.29mm/px · z∈[+1123,+1168]mm · 2 of 341 slices shown]
[im 114/341  lung]
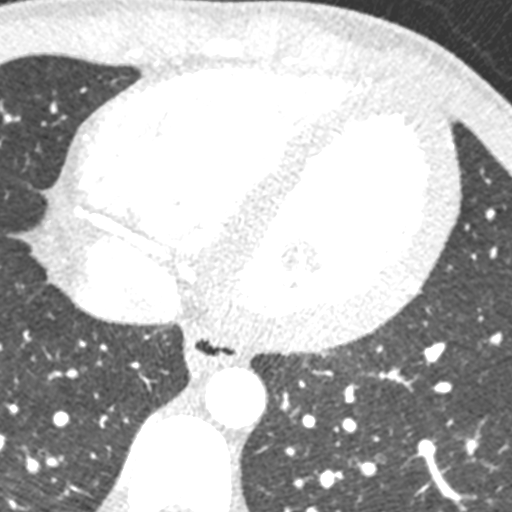
[im 227/341  lung]
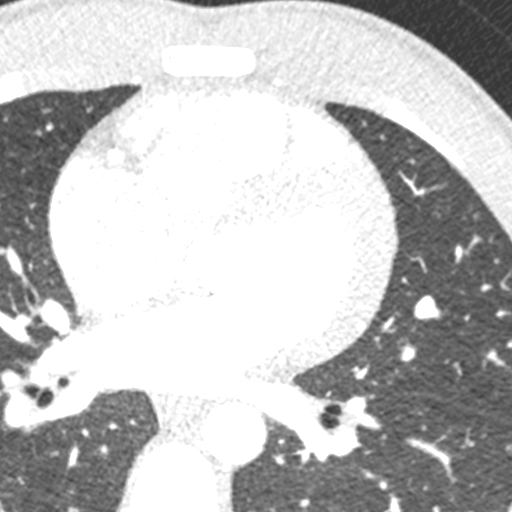

[Series 10: ts syst sharp 37 % · axial · 0.29mm/px · z∈[+1123,+1168]mm · 2 of 341 slices shown]
[im 114/341  lung]
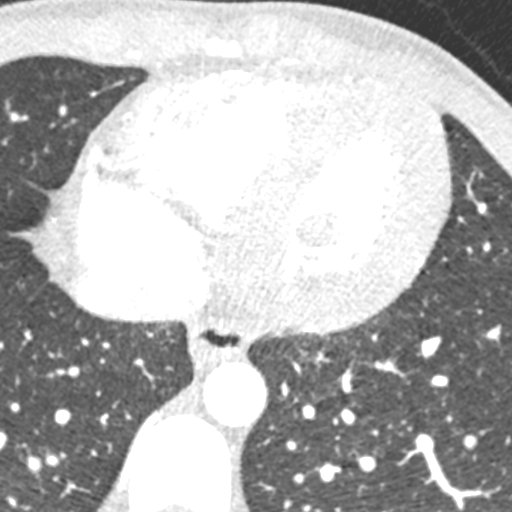
[im 227/341  lung]
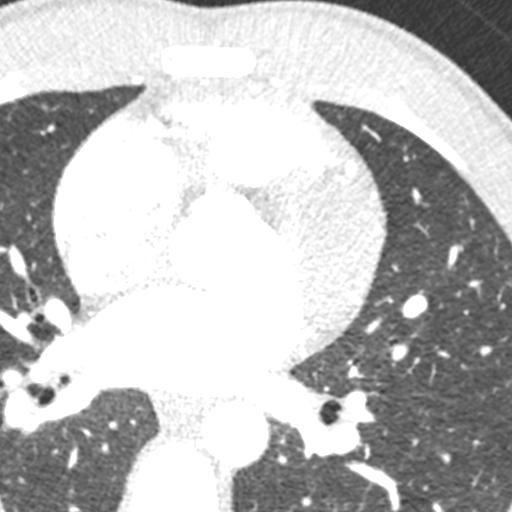

[8 of 20 positions shown; findings below may reference images not displayed]

FINDINGS: Non-cardiac: See separate report from [REDACTED].

Calcium Score:  0 Agatston units

Coronary Arteries: Right dominant with no anomalies

LM:  No plaque or stenosis.

LAD system:  No plaque or stenosis.

Circumflex system: Moderate ramus. The AV LCx itself was relatively
small. No plaque or stenosis.

RCA system:  No plaque or stenosis.
IMPRESSION: 1. Coronary calcium score 0 Agatston units suggesting low risk for
future cardiac events.

2.  No plaque or stenosis noted in the coronary arteries.

Shalin Yost

EXAM:
OVER-READ INTERPRETATION  CT CHEST

The following report is an over-read performed by radiologist Dr.
over-read does not include interpretation of cardiac or coronary
anatomy or pathology. The coronary CTA interpretation by the
cardiologist is attached.
FINDINGS: Limited view of the lung parenchyma demonstrates no suspicious
nodularity. Airways are normal.

Limited view of the mediastinum demonstrates no adenopathy.
Esophagus normal.

Limited view of the upper abdomen unremarkable.

Limited view of the skeleton and chest wall is unremarkable.
IMPRESSION: No significant extracardiac findings.
